# Patient Record
Sex: Male | Born: 1985 | Race: White | Hispanic: No | Marital: Single | Smoking: Current every day smoker
Health system: Southern US, Community
[De-identification: ages and names within clinical notes are randomized; demographics above are authoritative.]

---

## 2010-07-11 ENCOUNTER — Emergency Department: Payer: Self-pay | Admitting: Emergency Medicine

## 2010-08-08 ENCOUNTER — Emergency Department: Payer: Self-pay | Admitting: Emergency Medicine

## 2012-02-15 ENCOUNTER — Emergency Department: Payer: Self-pay | Admitting: Emergency Medicine

## 2012-03-04 ENCOUNTER — Emergency Department: Payer: Self-pay | Admitting: Emergency Medicine

## 2012-03-04 LAB — URINALYSIS, COMPLETE
Bacteria: NONE SEEN
Bilirubin,UR: NEGATIVE
Blood: NEGATIVE
Glucose,UR: NEGATIVE mg/dL (ref 0–75)
Ketone: NEGATIVE
Nitrite: NEGATIVE
Ph: 7 (ref 4.5–8.0)
RBC,UR: <1 /HPF (ref 0–5)

## 2012-03-04 LAB — CBC
HCT: 46.8 % (ref 40.0–52.0)
HGB: 15.4 g/dL (ref 13.0–18.0)
MCH: 30.1 pg (ref 26.0–34.0)
MCHC: 32.9 g/dL (ref 32.0–36.0)
RBC: 5.11 10*6/uL (ref 4.40–5.90)
RDW: 13.2 % (ref 11.5–14.5)
WBC: 15.1 10*3/uL — ABNORMAL HIGH (ref 3.8–10.6)

## 2012-03-04 LAB — COMPREHENSIVE METABOLIC PANEL
Alkaline Phosphatase: 75 U/L (ref 50–136)
Anion Gap: 10 (ref 7–16)
BUN: 10 mg/dL (ref 7–18)
Chloride: 104 mmol/L (ref 98–107)
Co2: 24 mmol/L (ref 21–32)
Creatinine: 0.85 mg/dL (ref 0.60–1.30)
EGFR (Non-African Amer.): >60
Glucose: 97 mg/dL (ref 65–99)
Potassium: 3.6 mmol/L (ref 3.5–5.1)

## 2012-03-04 LAB — DRUG SCREEN, URINE
Amphetamines, Ur Screen: NEGATIVE (ref ?–1000)
Barbiturates, Ur Screen: NEGATIVE (ref ?–200)
Benzodiazepine, Ur Scrn: NEGATIVE (ref ?–200)
Cannabinoid 50 Ng, Ur ~~LOC~~: POSITIVE (ref ?–50)
Cocaine Metabolite,Ur ~~LOC~~: NEGATIVE (ref ?–300)
Phencyclidine (PCP) Ur S: NEGATIVE (ref ?–25)
Tricyclic, Ur Screen: NEGATIVE (ref ?–1000)

## 2012-03-04 LAB — ETHANOL
Ethanol %: 0.007 % (ref 0.000–0.080)
Ethanol: 7 mg/dL

## 2012-04-14 ENCOUNTER — Emergency Department: Payer: Self-pay | Admitting: Emergency Medicine

## 2012-04-14 LAB — DRUG SCREEN, URINE
Amphetamines, Ur Screen: NEGATIVE (ref ?–1000)
Barbiturates, Ur Screen: NEGATIVE (ref ?–200)
Benzodiazepine, Ur Scrn: NEGATIVE (ref ?–200)
Cannabinoid 50 Ng, Ur ~~LOC~~: POSITIVE (ref ?–50)
Cocaine Metabolite,Ur ~~LOC~~: POSITIVE (ref ?–300)
MDMA (Ecstasy)Ur Screen: NEGATIVE (ref ?–500)
Phencyclidine (PCP) Ur S: NEGATIVE (ref ?–25)
Tricyclic, Ur Screen: NEGATIVE (ref ?–1000)

## 2012-04-14 LAB — COMPREHENSIVE METABOLIC PANEL
Albumin: 4.7 g/dL (ref 3.4–5.0)
Alkaline Phosphatase: 73 U/L (ref 50–136)
Anion Gap: 11 (ref 7–16)
Calcium, Total: 9.5 mg/dL (ref 8.5–10.1)
Chloride: 107 mmol/L (ref 98–107)
Glucose: 107 mg/dL — ABNORMAL HIGH (ref 65–99)
Osmolality: 280 (ref 275–301)
SGOT(AST): 34 U/L (ref 15–37)
Sodium: 141 mmol/L (ref 136–145)
Total Protein: 8.8 g/dL — ABNORMAL HIGH (ref 6.4–8.2)

## 2012-04-14 LAB — CBC
HCT: 48.4 % (ref 40.0–52.0)
MCH: 31 pg (ref 26.0–34.0)
MCHC: 33.5 g/dL (ref 32.0–36.0)
MCV: 93 fL (ref 80–100)
Platelet: 311 10*3/uL (ref 150–440)
RBC: 5.23 10*6/uL (ref 4.40–5.90)
RDW: 13.3 % (ref 11.5–14.5)

## 2012-04-14 LAB — ACETAMINOPHEN LEVEL: Acetaminophen: <2 ug/mL
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
## 2014-06-20 NOTE — Consult Note (Signed)
PATIENT NAMDahlia [name]:  [name], [name] MR#:  [id number] DATE OF BIRTH:  [birth year]  DATE OF CONSULTATION:  04/14/2012  REFERRING PHYSICIAN:   CONSULTING PHYSICIAN:  Sahithi Ordoyne K. Guss Bundehalla, MD  The patient is a 29 year old white male, not employed and last worked  many years ago. He reported that he has not been working because nobody is giving him a job. The patient is single, never married and lives by himself. The patient was [facility] after he got into arguments and agitation after he was drunk.  HISTORY OF PRESENT ILLNESS: According to information obtained, the patient has been off of his medications for a week because he heard on the TV that they might be harmful and they are as follows:  1.  Latuda 60 mg p.o. at bedtime. 2.  Remeron 15 mg at bedtime and being followed by [facility].   The patient is a poor historian and when he was asked he refused to tell when his appointment was and he reports that he did not keep up his followup appointments.  In addition, he reports that he is not schizophrenic, he is not hearing voices and there is no reason for him to take Latuda, which he refuses to take.  ALCOHOL AND DRUGS: He admits that he drinks alcohol. He could not give a good history about other drugs like marijuana or crack cocaine and he was evasive about the same.  MENTAL STATUS EXAM: The patient is alert and oriented. He is aware of the situation that brought [facility]. He was irritable and unpleasant and was a poor historian. Denies hearing voices. Denies being paranoid and admits that he is not a schizophrenic and he does not need any of the medications. Denies any ideas or plans to hurt himself or others and states "I will see you next week" and then he went back to sleep.  Insight and judgment guarded.   IMPRESSION: History of substance abuse and mood disorder, not otherwise specified.   PLAN: I recommend that the patient be continued on Latuda and Remeron [facility], but the  patient has been non-compliant with the medications. The patient is to be evaluated Monday morning on 04/16/2012 when Mr. Rudell CobbKent can find out more details from [facility] and appropriate followup and disposition plans can be made.     ____________________________ Jannet MantisSurya K. Guss Bundehalla, MD skc:aw D: 04/14/2012 20:44:14 ET T: 04/15/2012 07:23:57 ET JOB#: [id number]  cc: Monika SalkSurya K. Guss Bundehalla, MD, <Dictator> Beau FannySURYA K Jaylee Freeze MD ELECTRONICALLY SIGNED 04/21/2012 15:30
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
## 2014-11-28 ENCOUNTER — Encounter: Payer: Self-pay | Admitting: *Deleted

## 2014-11-28 ENCOUNTER — Emergency Department
Admission: EM | Admit: 2014-11-28 | Discharge: 2014-11-28 | Disposition: A | Discharge status: Home / Self Care | Payer: Self-pay | Attending: Student | Admitting: Student

## 2014-11-28 DIAGNOSIS — Z720 Tobacco use: Secondary | ICD-10-CM | POA: Insufficient documentation

## 2014-11-28 DIAGNOSIS — L0231 Cutaneous abscess of buttock: Secondary | ICD-10-CM | POA: Insufficient documentation

## 2014-11-28 MED ORDER — LIDOCAINE-EPINEPHRINE 1 %-1:100000 IJ SOLN: 1.3000 mL | Freq: Once | INTRAMUSCULAR | Status: DC

## 2014-11-28 MED ORDER — MORPHINE SULFATE (PF) 2 MG/ML IV SOLN
2.0000 mg | Freq: Once | INTRAVENOUS | Status: AC
  Administered 2014-11-28: 2 mg via INTRAMUSCULAR
  Filled 2014-11-28: qty 1

## 2014-11-28 MED ORDER — HYDROCODONE-ACETAMINOPHEN 5-325 MG PO TABS: 1.0000 | ORAL_TABLET | Freq: Four times a day (QID) | ORAL | Status: DC | PRN

## 2014-11-28 MED ORDER — LIDOCAINE HCL (PF) 1 % IJ SOLN
INTRAMUSCULAR | Status: AC
  Filled 2014-11-28: qty 5

## 2014-11-28 MED ORDER — SULFAMETHOXAZOLE-TRIMETHOPRIM 800-160 MG PO TABS: 1.0000 | ORAL_TABLET | Freq: Two times a day (BID) | ORAL | Status: DC

## 2014-11-28 MED ORDER — LIDOCAINE-EPINEPHRINE (PF) 1 %-1:200000 IJ SOLN
INTRAMUSCULAR | Status: AC
  Filled 2014-11-28: qty 30

## 2014-11-28 MED ORDER — SULFAMETHOXAZOLE-TRIMETHOPRIM 800-160 MG PO TABS
1.0000 | ORAL_TABLET | Freq: Once | ORAL | Status: AC
  Administered 2014-11-28: 1 via ORAL
  Filled 2014-11-28: qty 1

## 2014-11-28 NOTE — ED Notes (Signed)
AAOx3.  Skin warm and dry.  D/C home 

## 2014-11-28 NOTE — Discharge Instructions (Signed)
Abscess An abscess is an infected area that contains a collection of pus and debris.It can occur in almost any part of the body. An abscess is also known as a furuncle or boil. CAUSES  An abscess occurs when tissue gets infected. This can occur from blockage of oil or sweat glands, infection of hair follicles, or a minor injury to the skin. As the body tries to fight the infection, pus collects in the area and creates pressure under the skin. This pressure causes pain. People with weakened immune systems have difficulty fighting infections and get certain abscesses more often.  SYMPTOMS Usually an abscess develops on the skin and becomes a painful mass that is red, warm, and tender. If the abscess forms under the skin, you may feel a moveable soft area under the skin. Some abscesses break open (rupture) on their own, but most will continue to get worse without care. The infection can spread deeper into the body and eventually into the bloodstream, causing you to feel ill.  DIAGNOSIS  Your caregiver will take your medical history and perform a physical exam. A sample of fluid may also be taken from the abscess to determine what is causing your infection. TREATMENT  Your caregiver may prescribe antibiotic medicines to fight the infection. However, taking antibiotics alone usually does not cure an abscess. Your caregiver may need to make a small cut (incision) in the abscess to drain the pus. In some cases, gauze is packed into the abscess to reduce pain and to continue draining the area. HOME CARE INSTRUCTIONS   Only take over-the-counter or prescription medicines for pain, discomfort, or fever as directed by your caregiver.  If you were prescribed antibiotics, take them as directed. Finish them even if you start to feel better.  If gauze is used, follow your caregiver's directions for changing the gauze.  To avoid spreading the infection:  Keep your draining abscess covered with a  bandage.  Wash your hands well.  Do not share personal care items, towels, or whirlpools with others.  Avoid skin contact with others.  Keep your skin and clothes clean around the abscess.  Keep all follow-up appointments as directed by your caregiver.  Please have health care provider inspect wound in 2-3 days, remove packing and repackif necessary. SEEK MEDICAL CARE IF:   You have increased pain, swelling, redness, fluid drainage, or bleeding.  You have muscle aches, chills, or a general ill feeling.  You have a fever. MAKE SURE YOU:   Understand these instructions.  Will watch your condition.  Will get help right away if you are not doing well or get worse. Document Released: 11/24/2004 Document Revised: 08/16/2011 Document Reviewed: 04/29/2011 [facility] Patient Information 2015 [place], [place]. This information is not intended to replace advice given to you by your health care provider. Make sure you discuss any questions you have with your health care provider.

## 2014-11-28 NOTE — ED Notes (Signed)
AAOx3.  Skin warm and dry.  Patient c/o pain to right buttock.  Cranston Neighbor PA-C, no further action ordered.  RX sent home with patient.

## 2014-11-28 NOTE — ED Provider Notes (Signed)
CSN: [id number]     Arrival date & time 11/28/14  2040 History   First MD Initiated Contact with Patient 11/28/14 2052     Chief Complaint  Patient presents with  . Abscess     (Consider location/radiation/quality/duration/timing/severity/associated sxs/prior Treatment) HPI  29 year old male presents to emergency department for evaluation of right buttocks abscess. Patient has had pain swelling redness and warmth for the last 2 days. He states it started out as a pimple but quickly grew to be much larger and more painful over the last 2 days. He has not taken anything for pain. Pain is moderate to severe. He denies any fevers. No recent infections.  History reviewed. No pertinent past medical history. History reviewed. No pertinent past surgical history. No family history on file. Social History  Substance Use Topics  . Smoking status: Current Every Day Smoker  . Smokeless tobacco: None  . Alcohol Use: No    Review of Systems  Constitutional: Negative.  Negative for fever, chills, activity change and appetite change.  HENT: Negative for congestion, ear pain, mouth sores, rhinorrhea, sinus pressure, sore throat and trouble swallowing.   Eyes: Negative for photophobia, pain and discharge.  Respiratory: Negative for cough, chest tightness and shortness of breath.   Cardiovascular: Negative for chest pain and leg swelling.  Gastrointestinal: Negative for nausea, vomiting, abdominal pain, diarrhea and abdominal distention.  Genitourinary: Negative for dysuria and difficulty urinating.  Musculoskeletal: Negative for back pain, arthralgias and gait problem.  Skin: Positive for wound. Negative for color change and rash.  Neurological: Negative for dizziness and headaches.  Hematological: Negative for adenopathy.  Psychiatric/Behavioral: Negative for behavioral problems and agitation.      Allergies  Review of patient's allergies indicates no known allergies.  Home Medications    Prior to Admission medications   Medication Sig Start Date End Date Taking? Authorizing Rachelanne Whidby  HYDROcodone-acetaminophen (NORCO) 5-325 MG tablet Take 1 tablet by mouth every 6 (six) hours as needed for moderate pain. 11/28/14   Evon Slack, PA-C  sulfamethoxazole-trimethoprim (BACTRIM DS,SEPTRA DS) 800-160 MG tablet Take 1 tablet by mouth 2 (two) times daily. 11/28/14   Evon Slack, PA-C   BP 114/66 mmHg  Pulse 69  Temp(Src) 98 F (36.7 C)  Resp 16  Ht  (1.803 m)  Wt 170 lb (77.111 kg)  BMI 23.72 kg/m2  SpO2 99% Physical Exam  Constitutional: He is oriented to person, place, and time. He appears well-developed and well-nourished.  HENT:  Head: Normocephalic and atraumatic.  Eyes: Conjunctivae and EOM are normal. Pupils are equal, round, and reactive to light.  Neck: Normal range of motion. Neck supple.  Cardiovascular: Normal rate, regular rhythm, normal heart sounds and intact distal pulses.   Pulmonary/Chest: Effort normal and breath sounds normal. No respiratory distress. He has no wheezes.  Musculoskeletal: Normal range of motion. He exhibits no edema or tenderness.  Neurological: He is alert and oriented to person, place, and time.  Skin: Skin is warm and dry.  Examination of the right buttock shows a 5 x 5 cm abscess that is hard and indurated with moderate erythema and warmth. There is a centralized area of ulceration that is draining minimal purulent discharge. Abscess is tender to palpation.  Psychiatric: He has a normal mood and affect. His behavior is normal. Judgment and thought content normal.    ED Course  Procedures (including critical care time) INCISION AND DRAINAGE Performed by: Patience Musca Consent: Verbal consent obtained. Risks and benefits:  risks, benefits and alternatives were discussed Type: abscess  Body area: Right buttocks  Anesthesia: local infiltration  Incision was made with a scalpel.  Local anesthetic:  lidocaine 1% with epinephrine  Anesthetic total: 5 ml  Complexity: complex Blunt dissection to break up loculations  Drainage: purulent  Drainage amount: Moderate   Packing material: 1/4 in iodoform gauze  Patient tolerance: Patient tolerated the procedure well with no immediate complications.   Labs Review Labs Reviewed - No data to display  Imaging Review No results found. I have personally reviewed and evaluated these images and lab results as part of my medical decision-making.   EKG Interpretation None      MDM   Final diagnoses:  Cutaneous abscess of buttock    29 year old male with abscess to the right buttocks. Incision and drainage was performed. Moderate drainage. Occasions. Placed on Bactrim DS. Return for wound repacking in 2-3 days    Evon Slack, PA-C 11/28/14 2142  Gayla Doss, MD 11/29/14 [phone]

## 2014-11-28 NOTE — ED Notes (Signed)
Pt reports boil on right buttocks for two days.

## 2015-06-25 ENCOUNTER — Emergency Department
Admission: EM | Admit: 2015-06-25 | Discharge: 2015-06-25 | Disposition: A | Discharge status: Home / Self Care | Payer: Self-pay | Attending: Emergency Medicine | Admitting: Emergency Medicine

## 2015-06-25 ENCOUNTER — Encounter: Payer: Self-pay | Admitting: Emergency Medicine

## 2015-06-25 DIAGNOSIS — Y929 Unspecified place or not applicable: Secondary | ICD-10-CM | POA: Insufficient documentation

## 2015-06-25 DIAGNOSIS — Y990 Civilian activity done for income or pay: Secondary | ICD-10-CM | POA: Insufficient documentation

## 2015-06-25 DIAGNOSIS — S39012A Strain of muscle, fascia and tendon of lower back, initial encounter: Secondary | ICD-10-CM | POA: Insufficient documentation

## 2015-06-25 DIAGNOSIS — X501XXA Overexertion from prolonged static or awkward postures, initial encounter: Secondary | ICD-10-CM | POA: Insufficient documentation

## 2015-06-25 DIAGNOSIS — Y9389 Activity, other specified: Secondary | ICD-10-CM | POA: Insufficient documentation

## 2015-06-25 DIAGNOSIS — F1490 Cocaine use, unspecified, uncomplicated: Secondary | ICD-10-CM | POA: Insufficient documentation

## 2015-06-25 DIAGNOSIS — F17200 Nicotine dependence, unspecified, uncomplicated: Secondary | ICD-10-CM | POA: Insufficient documentation

## 2015-06-25 MED ORDER — METHOCARBAMOL 500 MG PO TABS: 500.0000 mg | ORAL_TABLET | Freq: Four times a day (QID) | ORAL | Status: AC | PRN

## 2015-06-25 MED ORDER — IBUPROFEN 800 MG PO TABS: 800.0000 mg | ORAL_TABLET | Freq: Three times a day (TID) | ORAL | Status: AC | PRN

## 2015-06-25 NOTE — ED Provider Notes (Signed)
[facility] Emergency Department Provider Note  ____________________________________________  Time seen: Approximately 11:54 AM  I have reviewed the triage vital signs and the nursing notes.   HISTORY  Chief Complaint Back Pain    HPI [name] is a 30 y.o. male presents for evaluation of low back pain which started several days ago. Patient reports starting new job with a lot of bending and lifting. Patient states that he stays hunched over for 12-16 hours a day. Denies any direct trauma rates his pain as 10 over 10 at times. Nonradiating. Denies any numbness or tingling.   History reviewed. No pertinent past medical history.  There are no active problems to display for this patient.   History reviewed. No pertinent past surgical history.  Current Outpatient Rx  Name  Route  Sig  Dispense  Refill  . ibuprofen (ADVIL,MOTRIN) 800 MG tablet   Oral   Take 1 tablet (800 mg total) by mouth every 8 (eight) hours as needed.   30 tablet   0   . methocarbamol (ROBAXIN) 500 MG tablet   Oral   Take 1 tablet (500 mg total) by mouth every 6 (six) hours as needed for muscle spasms.   30 tablet   0     Allergies Review of patient's allergies indicates no known allergies.  No family history on file.  Social History Social History  Substance Use Topics  . Smoking status: Current Every Day Smoker  . Smokeless tobacco: None  . Alcohol Use: No    Review of Systems Constitutional: No fever/chills Genitourinary: Negative for dysuria. Musculoskeletal: Positive for low back pain. Skin: Negative for rash. Neurological: Negative for headaches, focal weakness or numbness.  10-point ROS otherwise negative.  ____________________________________________   PHYSICAL EXAM:  VITAL SIGNS: ED Triage Vitals  Enc Vitals Group     BP 06/25/15 1150 127/67 mmHg     Pulse Rate 06/25/15 1150 94     Resp 06/25/15 1150 20     Temp 06/25/15 1150 98.1 F (36.7  C)     Temp Source 06/25/15 1150 Oral     SpO2 06/25/15 1150 99 %     Weight 06/25/15 1150 170 lb (77.111 kg)     Height 06/25/15 1150  (1.803 m)     Head Cir --      Peak Flow --      Pain Score 06/25/15 1151 7     Pain Loc --      Pain Edu? --      Excl. in GC? --     Constitutional: Alert and oriented. Well appearing and in no acute distress. Cardiovascular: Normal rate, regular rhythm. Grossly normal heart sounds.  Good peripheral circulation. Respiratory: Normal respiratory effort.  No retractions. Lungs CTAB. Musculoskeletal Point tenderness to lumbar paraspinal muscle area. No spinal tenderness right leg raise negative bilaterally Neurologic:  Normal speech and language. No gross focal neurologic deficits are appreciated. No gait instability. Skin:  Skin is warm, dry and intact. No rash noted. Psychiatric: Mood and affect are normal. Speech and behavior are normal.  ____________________________________________   LABS (all labs ordered are listed, but only abnormal results are displayed)  Labs Reviewed - No data to display   RADIOLOGY  Deferred ____________________________________________   PROCEDURES  Procedure(s) performed: None  Critical Care performed: No  ____________________________________________   INITIAL IMPRESSION / ASSESSMENT AND PLAN / ED COURSE  Pertinent labs & imaging results that were available during my care of the  patient were reviewed by me and considered in my medical decision making (see chart for details).  She lumbar sacral strain and work related. Reassurance provided Rx given for Robaxin 500 mg, ibuprofen 800 mg. Work note 1 day given patient to follow up with PCP or return to the ER with any worsening symptomology. ____________________________________________   FINAL CLINICAL IMPRESSION(S) / ED DIAGNOSES  Final diagnoses:  Lumbar strain, initial encounter     This chart was dictated using voice recognition  software/Dragon. Despite best efforts to proofread, errors can occur which can change the meaning. Any change was purely unintentional.   Evangeline Dakinharles M Andron Marrazzo, PA-C 06/25/15 1201  Sharyn CreamerMark Quale, MD 06/25/15 1606

## 2015-06-25 NOTE — Discharge Instructions (Signed)

## 2015-06-25 NOTE — ED Notes (Signed)
Developed lower back pain several backs ago denies any specific injury but has started a new job with a lot of standing and bending

## 2015-09-29 DEATH — deceased
